# Patient Record
Sex: Male | Born: 1975 | Race: White | Hispanic: No | Smoking: Never smoker
Health system: Southern US, Community
[De-identification: ages and names within clinical notes are randomized; demographics above are authoritative.]

---

## 2019-10-02 ENCOUNTER — Emergency Department (HOSPITAL_COMMUNITY)
Admission: EM | Admit: 2019-10-02 | Discharge: 2019-10-02 | Disposition: A | Discharge status: Home / Self Care | Payer: No Typology Code available for payment source | Attending: Emergency Medicine | Admitting: Emergency Medicine

## 2019-10-02 ENCOUNTER — Other Ambulatory Visit: Payer: Self-pay

## 2019-10-02 ENCOUNTER — Emergency Department (HOSPITAL_COMMUNITY): Payer: No Typology Code available for payment source

## 2019-10-02 ENCOUNTER — Encounter (HOSPITAL_COMMUNITY): Payer: Self-pay | Admitting: Emergency Medicine

## 2019-10-02 DIAGNOSIS — Z20822 Contact with and (suspected) exposure to covid-19: Secondary | ICD-10-CM | POA: Insufficient documentation

## 2019-10-02 DIAGNOSIS — Y990 Civilian activity done for income or pay: Secondary | ICD-10-CM | POA: Insufficient documentation

## 2019-10-02 DIAGNOSIS — M79642 Pain in left hand: Secondary | ICD-10-CM

## 2019-10-02 DIAGNOSIS — Y929 Unspecified place or not applicable: Secondary | ICD-10-CM | POA: Insufficient documentation

## 2019-10-02 DIAGNOSIS — S62633A Displaced fracture of distal phalanx of left middle finger, initial encounter for closed fracture: Secondary | ICD-10-CM | POA: Diagnosis not present

## 2019-10-02 DIAGNOSIS — S6992XA Unspecified injury of left wrist, hand and finger(s), initial encounter: Secondary | ICD-10-CM | POA: Diagnosis present

## 2019-10-02 DIAGNOSIS — Y939 Activity, unspecified: Secondary | ICD-10-CM | POA: Insufficient documentation

## 2019-10-02 DIAGNOSIS — S62661A Nondisplaced fracture of distal phalanx of left index finger, initial encounter for closed fracture: Secondary | ICD-10-CM

## 2019-10-02 DIAGNOSIS — X58XXXA Exposure to other specified factors, initial encounter: Secondary | ICD-10-CM | POA: Insufficient documentation

## 2019-10-02 LAB — RESPIRATORY PANEL BY RT PCR (FLU A&B, COVID)
Influenza A by PCR: NEGATIVE
Influenza B by PCR: NEGATIVE
SARS Coronavirus 2 by RT PCR: NEGATIVE

## 2019-10-02 MED ORDER — CEFAZOLIN SODIUM-DEXTROSE 2-4 GM/100ML-% IV SOLN: 2.0000 g | Freq: Three times a day (TID) | INTRAVENOUS | Status: DC

## 2019-10-02 NOTE — ED Provider Notes (Signed)
[facility] DEPT Provider Note   CSN: [id number] Arrival date & time: 10/02/19  1430     History Chief Complaint  Patient presents with  . Hand Pain    Dennis Booker is a 44 y.o. male.  44 y.o male with no PMH presents to the ED with a chief complaint of left index finger x yesterday.  Patient had an accident while at work, reports he was seen at [facility] emergency department, state he had x-rays done which showed that they were unable to salvage the distal aspect of his left index finger.  Patient reports he was sent home on antibiotics along with hydrocodone for pain control.  He reports he has been taking this medication while at work, does make him somewhat drowsy and unable to work.  He also reports looking at the wound and stating it looked "weird ".  He denies any other complaints or injuries.  The history is provided by the patient and medical records.  Hand Pain This is a new problem.       History reviewed. No pertinent past medical history.  There are no problems to display for this patient.   History reviewed. No pertinent surgical history.     No family history on file.  Social History   Tobacco Use  . Smoking status: Never Smoker  . Smokeless tobacco: Never Used  Substance Use Topics  . Alcohol use: Never  . Drug use: Never    Home Medications Prior to Admission medications   Not on File    Allergies    Patient has no allergy information on record.  Review of Systems   Review of Systems  Constitutional: Negative for fever.  Musculoskeletal: Positive for arthralgias.  Skin: Positive for color change and wound.    Physical Exam Updated Vital Signs BP (!) 152/99 (BP Location: Right Arm)   Pulse 89   Temp 98 F (36.7 C) (Oral)   Resp 19   SpO2 99%   Physical Exam Vitals and nursing note reviewed.  Constitutional:      Appearance: Normal appearance.  HENT:     Head: Normocephalic and atraumatic.       Mouth/Throat:     Mouth: Mucous membranes are moist.  Eyes:     Pupils: Pupils are equal, round, and reactive to light.  Cardiovascular:     Rate and Rhythm: Normal rate.  Pulmonary:     Effort: Pulmonary effort is normal.  Abdominal:     General: Abdomen is flat.  Musculoskeletal:        General: Tenderness and signs of injury present.     Left hand: Swelling and tenderness present. No bony tenderness. Normal strength. Normal sensation.     Cervical back: Normal range of motion and neck supple.     Comments: Pulses present, ttp at the distal aspect, sensation is intact.   Skin:    General: Skin is warm and dry.     Findings: Erythema present.  Neurological:     Mental Status: He is alert and oriented to person, place, and time.         ED Results / Procedures / Treatments   Labs (all labs ordered are listed, but only abnormal results are displayed) Labs Reviewed  RESPIRATORY PANEL BY RT PCR (FLU A&B, COVID)    EKG None  Radiology DG Finger Index Left  Result Date: 10/02/2019 CLINICAL DATA:  Status post trauma. EXAM: LEFT INDEX FINGER 2+V COMPARISON:  None.  FINDINGS: A mildly displaced fracture is seen involving the tuft of the distal phalanx of the second left finger. There is no evidence of dislocation. A superficial soft tissue defect is also seen within this region. IMPRESSION: Mildly displaced fracture involving the tuft of the distal phalanx of the second left finger. Electronically Signed   By: Aram Candela M.D.   On: 10/02/2019 16:40    Procedures Procedures (including critical care time)  Medications Ordered in ED Medications - No data to display  ED Course  I have reviewed the triage vital signs and the nursing notes.  Pertinent labs & imaging results that were available during my care of the patient were reviewed by me and considered in my medical decision making (see chart for details).    MDM Rules/Calculators/A&P   Patient presents  with left index finger pain.  Reports he was above then an accident while at work, states he was seen at [facility] yesterday placed on antibiotics and had his finger repair.  He reports finger looks discolored to him on today's workday, also reports the medication that he is taking hydrocodone is making him somewhat drowsy while at work.   Left index finger appears discolored, dusky, pulses are present along with capillary refill and sensation are intact.  Will obtain curbside orthopedic consultation for further evaluation.  Spoke to Charma Igo PA who advised to obtain x-ray repeat, also will need rapid Covid along with further evaluation by Dr. Amanda Pea at [facility].  Patient is hemodynamically stable, able to go via POV.  Xray of the left index finger showed: Mildly displaced fracture involving the tuft of the distal phalanx  of the second left finger.     This was discussed with patient at length, rapid Covid was obtained, he was advised to head over to [facility] for further evaluation.  He is to remain n.p.o.  She is stable for discharge along with transfer via POV to [facility].   Portions of this note were generated with Scientist, clinical (histocompatibility and immunogenetics). Dictation errors may occur despite best attempts at proofreading.  Final Clinical Impression(s) / ED Diagnoses Final diagnoses:  Left hand pain  Nondisplaced fracture of distal phalanx of left index finger, initial encounter for closed fracture    Rx / DC Orders ED Discharge Orders    None       Claude Manges, PA-C 10/02/19 1652    Tegeler, Canary Brim, MD 10/02/19 [phone]

## 2019-10-02 NOTE — ED Triage Notes (Signed)
Patient here from home with complaints of left pointer finger injury. States that pain had increased and is unable to work.

## 2019-10-02 NOTE — Discharge Instructions (Addendum)
Report to the office at 10 AM Wednesday 10/03/19.

## 2019-10-02 NOTE — ED Notes (Signed)
Pt verbalizes he understands to go to [facility] via pvt vehicle as a transfer from [employer]. He was given directions, left ambulatory

## 2019-10-02 NOTE — ED Provider Notes (Signed)
[facility] EMERGENCY DEPARTMENT Provider Note   CSN: [id number] Arrival date & time: 10/02/19  1430     History Chief Complaint  Patient presents with  . Hand Pain    Dennis Booker is a 44 y.o. male.  Patient seen earlier today at [facility] for evaluation of left index finger injury that occurred at work yestreday. 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 After consultation with orthopedics, he was sent to the ED at [facility] for evaluation by Dr. Amedeo Plenty.  The history is provided by medical records and the patient. No language interpreter was used.  Hand Pain This is a new problem. The current episode started 2 days ago.       History reviewed. No pertinent past medical history.  There are no problems to display for this patient.   History reviewed. No pertinent surgical history.     No family history on file.  Social History   Tobacco Use  . Smoking status: Never Smoker  . Smokeless tobacco: Never Used  Substance Use Topics  . Alcohol use: Never  . Drug use: Never    Home Medications Prior to Admission medications   Not on File    Allergies    Patient has no allergy information on record.  Review of Systems   Review of Systems  Musculoskeletal:       Finger injury  All other systems reviewed and are negative.   Physical Exam Updated Vital Signs BP (!) 152/99 (BP Location: Right Arm)   Pulse 89   Temp 98 F (36.7 C) (Oral)   Resp 19   SpO2 99%   Physical Exam Musculoskeletal:        General: Signs of injury present.  Neurological:     Mental Status: He is alert.         ED Results / Procedures / Treatments   Labs (all labs ordered are listed, but only abnormal results are displayed) Labs Reviewed  RESPIRATORY PANEL BY RT PCR (FLU A&B, COVID)    EKG None  Radiology DG Finger Index Left  Result Date: 10/02/2019 CLINICAL DATA:  Status post trauma.  EXAM: LEFT INDEX FINGER 2+V COMPARISON:  None. FINDINGS: A mildly displaced fracture is seen involving the tuft of the distal phalanx of the second left finger. There is no evidence of dislocation. A superficial soft tissue defect is also seen within this region. IMPRESSION: Mildly displaced fracture involving the tuft of the distal phalanx of the second left finger. Electronically Signed   By: Virgina Norfolk M.D.   On: 10/02/2019 16:40    Procedures Procedures (including critical care time)  Medications Ordered in ED Medications - No data to display  ED Course  I have reviewed the triage vital signs and the nursing notes.  Pertinent labs & imaging results that were available during my care of the patient were reviewed by me and considered in my medical decision making (see chart for details).    MDM Rules/Calculators/A&P                      Patient seen by Dr. Amedeo Plenty in the ED. He assessed the patient and provided wound care. Dr Amedeo Plenty will see the patient in the office at 10 AM tomorrow for outpatient procedure. Final Clinical Impression(s) / ED Diagnoses Final diagnoses:  Left hand pain  Nondisplaced fracture of distal phalanx of left index finger, initial encounter  for closed fracture    Rx / DC Orders ED Discharge Orders    None       Felicie Morn, NP 10/02/19 Nena Polio, MD 10/02/19 2110

## 2021-07-01 IMAGING — CR DG FINGER INDEX 2+V*L*
3 series · 3 of 3 positions shown · non-contrast
Comparison: None.

CLINICAL DATA: Status post trauma.

EXAM:
LEFT INDEX FINGER 2+V

[x finger pa left]
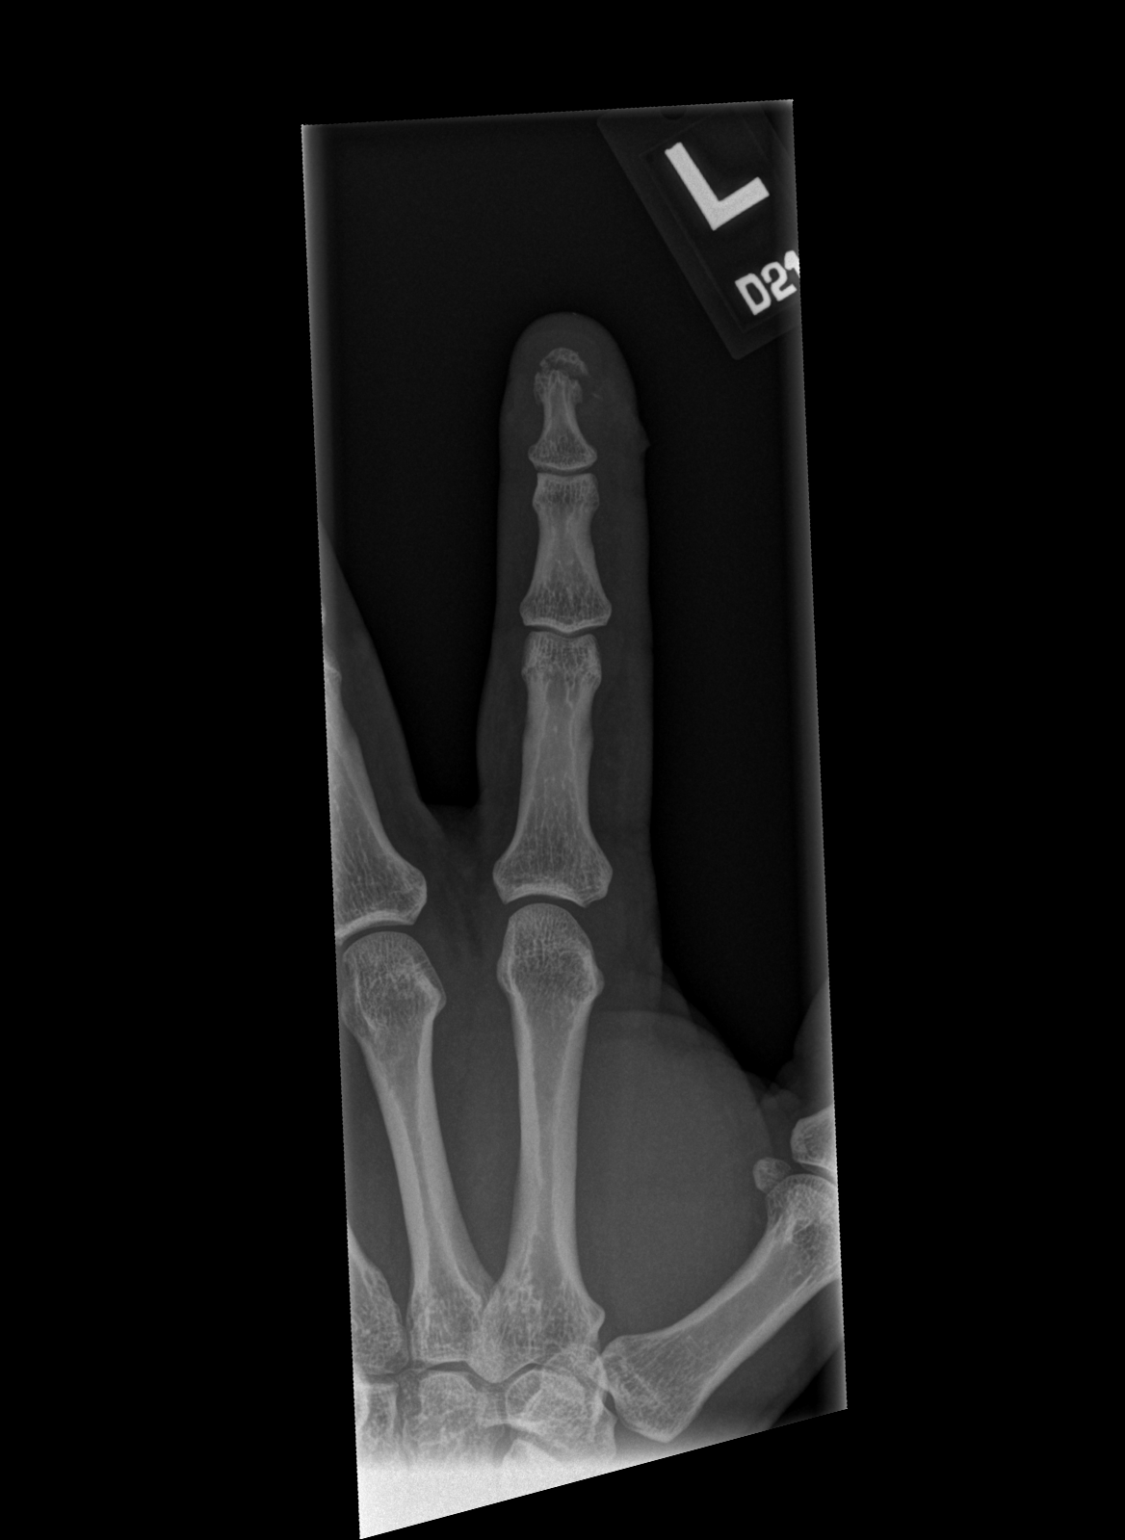

[x finger obl left]
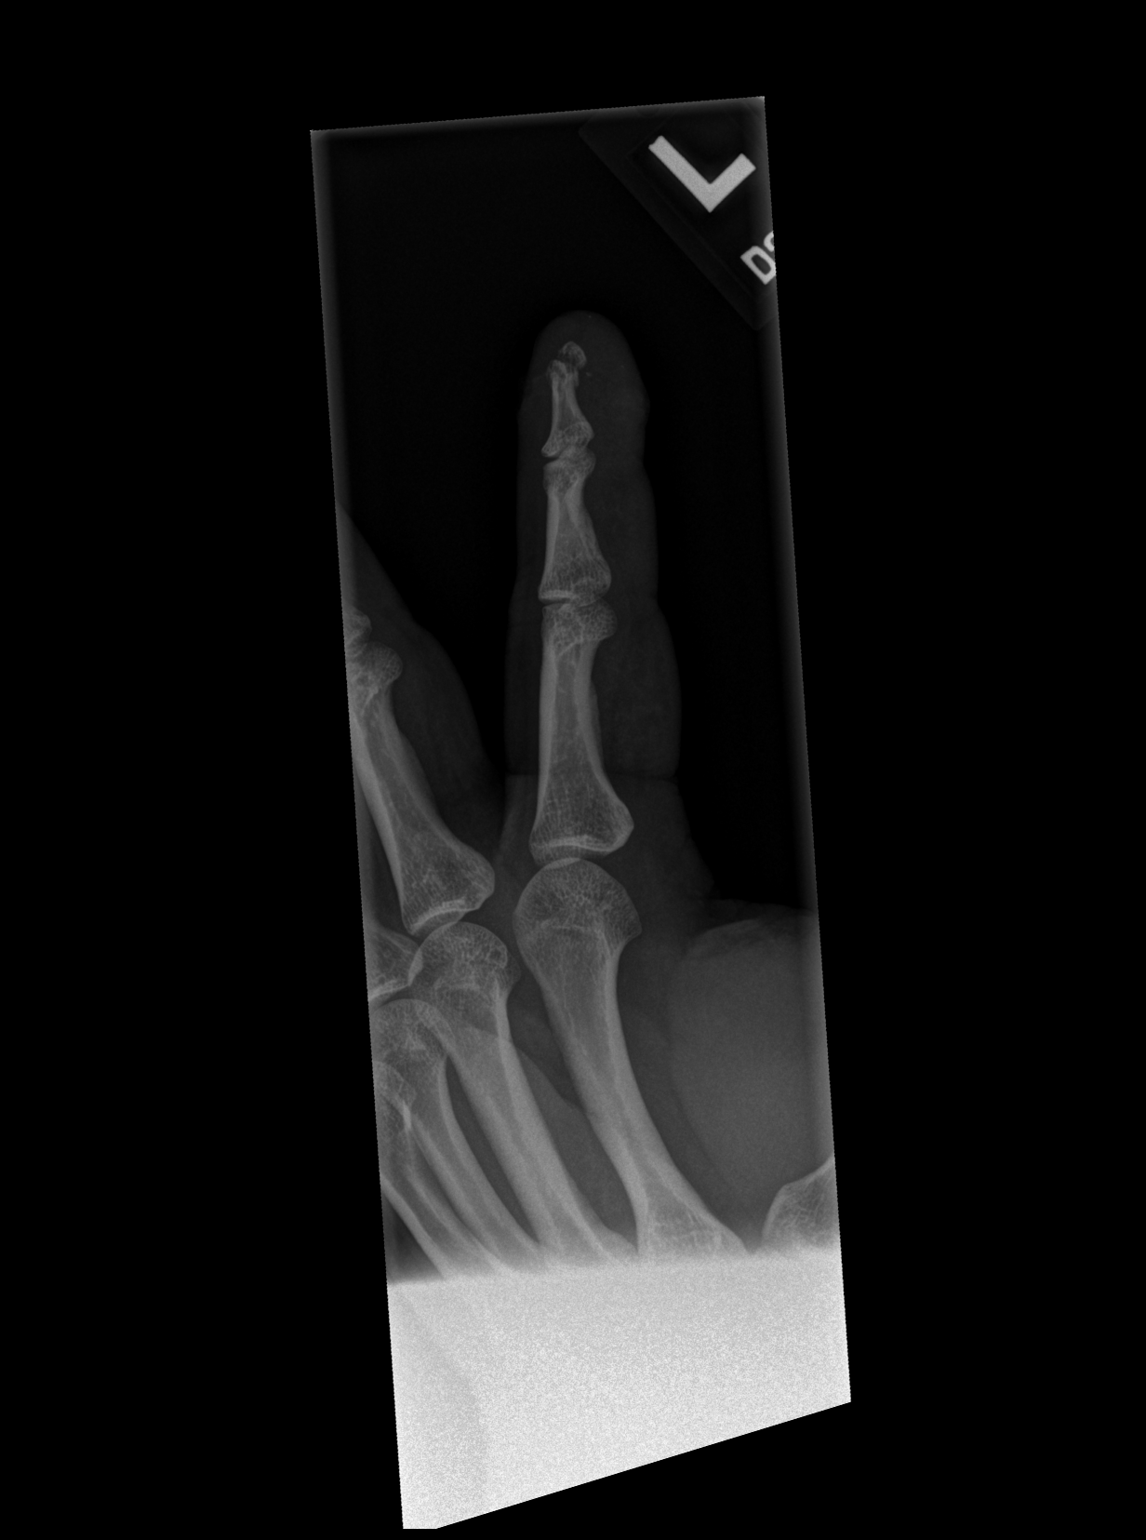

[x finger lat left]
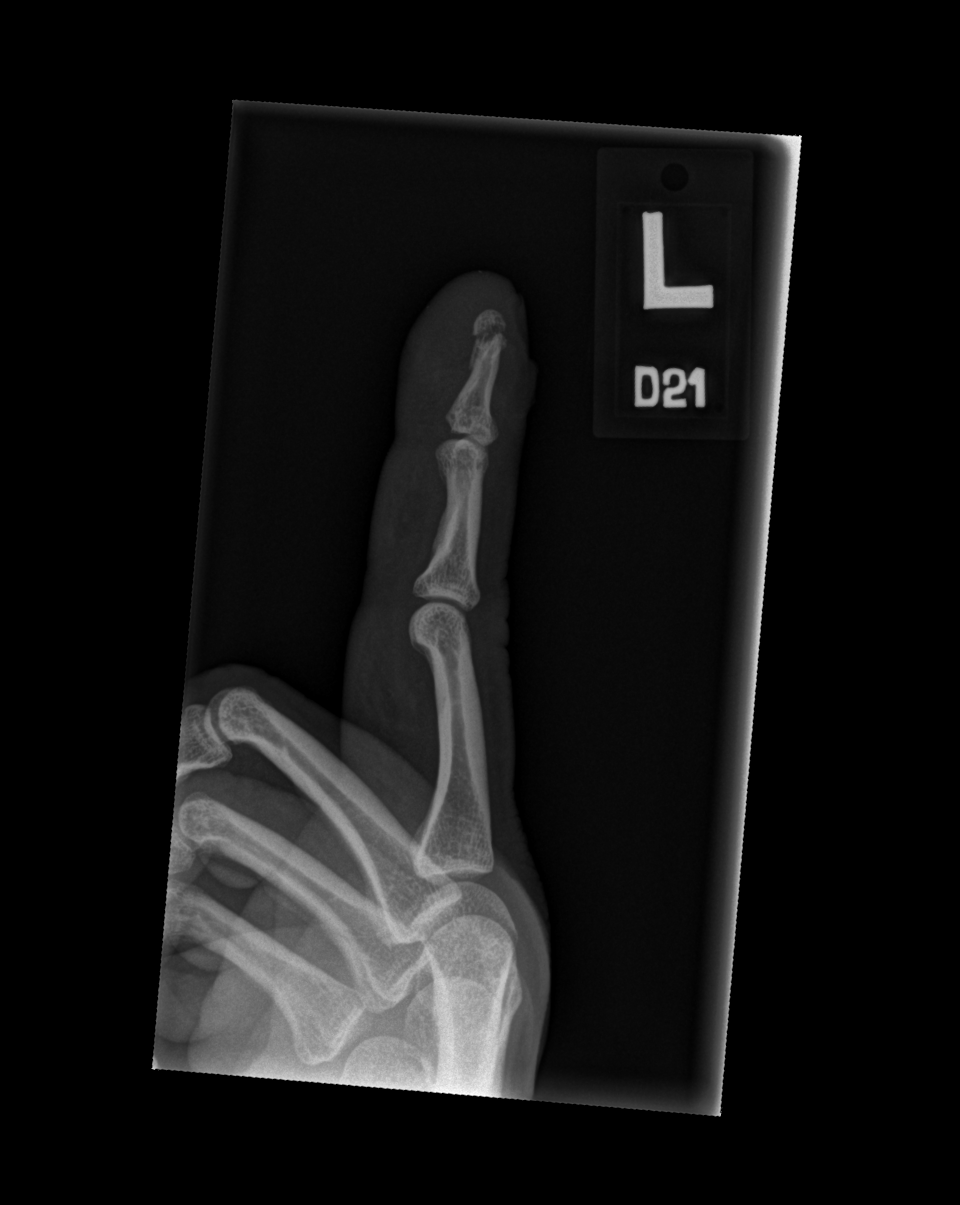

[3 of 3 positions shown; findings below may reference images not displayed]

FINDINGS: A mildly displaced fracture is seen involving the tuft of the distal
phalanx of the second left finger. There is no evidence of
dislocation. A superficial soft tissue defect is also seen within
this region.
IMPRESSION: Mildly displaced fracture involving the tuft of the distal phalanx
of the second left finger.
